# Patient Record
Sex: Male | Born: 1935 | Race: White | Hispanic: No | Marital: Married | Smoking: Former smoker
Health system: Southern US, Community
[De-identification: ages and names within clinical notes are randomized; demographics above are authoritative.]

## PROBLEM LIST (undated history)

## (undated) DIAGNOSIS — I10 Essential (primary) hypertension: Secondary | ICD-10-CM

## (undated) DIAGNOSIS — C4491 Basal cell carcinoma of skin, unspecified: Secondary | ICD-10-CM

## (undated) DIAGNOSIS — N200 Calculus of kidney: Secondary | ICD-10-CM

## (undated) DIAGNOSIS — IMO0002 Reserved for concepts with insufficient information to code with codable children: Secondary | ICD-10-CM

## (undated) DIAGNOSIS — E7800 Pure hypercholesterolemia, unspecified: Secondary | ICD-10-CM

## (undated) HISTORY — PX: TONSILLECTOMY: SUR1361

## (undated) HISTORY — PX: PROSTATE SURGERY: SHX751

## (undated) HISTORY — PX: APPENDECTOMY: SHX54

## (undated) HISTORY — PX: INGUINAL HERNIA REPAIR: SUR1180

---
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
## 2007-07-02 ENCOUNTER — Ambulatory Visit (HOSPITAL_COMMUNITY): Admission: RE | Admit: 2007-07-02 | Discharge: 2007-07-03 | Payer: Self-pay | Admitting: Ophthalmology

## 2010-12-06 NOTE — Op Note (Signed)
Joel Sanchez, Joel Sanchez          ACCOUNT NO.:  [id number]   MEDICAL RECORD NO.:  [id number]          PATIENT TYPE:  AMB   LOCATION:  SDS                          FACILITY:  [facility]   PHYSICIAN:  Rider D. Ashley Royalty, M.D. 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   PROCEDURE:  Scleral buckle, left eye. Retinal photocoagulation, left  eye.   SURGEON:  Keelan Tripodi. Ashley Royalty, M.D.   ASSISTANT:  Rosalie Doctor, M.A.   ANESTHESIA:  General.   DETAILS:  Usual prep and drape, 360-degree limbal peritomy, isolation of  4 rectus muscles on 2-0 silk. Scleral dissection for 360 degrees to  admit a #279 intrascleral implant. Diathermy placed in the bed. A 508G  radial segment was fashioned to fit beneath the break at 1 o'clock.  Perforation site at 3 o'clock revealed a moderate amount of clear  colorless subretinal fluid. The 279 implant was placed against the globe  with the joint at 10 o'clock. A 240 band placed around the eye with a  270 sleeve at 10 o'clock. The buckle was adjusted and trimmed. The band  was adjusted and trimmed. The radial element was placed. Indirect  ophthalmoscopy showed the retina to be lying nicely on the scleral  buckle with the break well supported. Two sutures per quadrant for a  total of 8 scleral sutures were placed in the scleral flaps. These were  knotted and free ends removed. The indirect ophthalmoscopic laser  was  moved into place, and 906 burns were placed around the retinal break and  around the periphery with power between 400 and 800 milliwatts, 1000  microns each and 0.1 seconds. The conjunctivae was reposited with 7-0  chromic suture. Polymyxin and gentamicin were irrigated in the tenon  space. Atropine solution was applied. Marcaine was injected around the  globe for postoperative pain. Decadron 10  mg was injected into the lower  subconjunctival space. Closing pressure was 15 with a Baer keratometer  after paracentesis x3. TobraDex ophthalmic ointment, a patch and shield  were placed. The patient was awakened and taken to recovery room in  satisfactory condition.   COMPLICATIONS:  None.   CLOSING PRESSURE:  15.   OPERATIVE TIME:  2 hours.      Joel Sanchez. Ashley Royalty, M.D.  Electronically Signed     JDM/MEDQ  D:  07/02/2007  T:  07/03/2007  Job:  [id number]

## 2011-05-01 LAB — CBC
HCT: 43.9
RBC: 4.15 — ABNORMAL LOW
WBC: 5.7

## 2011-07-25 DIAGNOSIS — N200 Calculus of kidney: Secondary | ICD-10-CM

## 2011-07-25 HISTORY — DX: Calculus of kidney: N20.0

## 2013-06-23 DIAGNOSIS — IMO0002 Reserved for concepts with insufficient information to code with codable children: Secondary | ICD-10-CM

## 2013-06-23 HISTORY — DX: Reserved for concepts with insufficient information to code with codable children: IMO0002

## 2013-06-23 HISTORY — PX: SKIN CANCER EXCISION: SHX779

## 2013-07-02 ENCOUNTER — Encounter (INDEPENDENT_AMBULATORY_CARE_PROVIDER_SITE_OTHER): Payer: Medicare HMO | Admitting: Ophthalmology

## 2013-07-02 DIAGNOSIS — H33009 Unspecified retinal detachment with retinal break, unspecified eye: Secondary | ICD-10-CM

## 2013-07-02 DIAGNOSIS — H35039 Hypertensive retinopathy, unspecified eye: Secondary | ICD-10-CM

## 2013-07-02 DIAGNOSIS — I10 Essential (primary) hypertension: Secondary | ICD-10-CM

## 2013-07-02 DIAGNOSIS — H43819 Vitreous degeneration, unspecified eye: Secondary | ICD-10-CM

## 2013-07-02 DIAGNOSIS — H4310 Vitreous hemorrhage, unspecified eye: Secondary | ICD-10-CM

## 2013-07-02 NOTE — H&P (Signed)
Joel Sanchez is an 79 y.o. male.   Chief Complaint: sudden floaters and loss of vision left eye HPI: Previous retinal detachment,  Now vitreous hemorrhage left eye  No past medical history on file.  No past surgical history on file.  No family history on file. Social History:  has no tobacco, alcohol, and drug history on file.  Allergies: Allergies not on file  No prescriptions prior to admission    Review of systems otherwise negative  There were no vitals taken for this visit.  Physical exam: Mental status: oriented x3. Eyes: See eye exam associated with this date of surgery in media tab.  Scanned in by scanning center Ears, Nose, Throat: within normal limits Neck: Within Normal limits General: within normal limits Chest: Within normal limits Breast: deferred Heart: Within normal limits Abdomen: Within normal limits GU: deferred Extremities: within normal limits Skin: within normal limits  Assessment/Plan Vitreous hemorrhage left eye Plan: To [facility] for Pars plana vitrectomy, laser, gas injection left eye  Verlaine Embry, DERRIUS FURTICK 07/02/2013, 5:20 PM

## 2013-07-21 NOTE — Progress Notes (Signed)
Attempted to call for SDW questions. No one answered the phone and answering machine came on.  Will attempt to call back later.

## 2013-07-28 ENCOUNTER — Encounter (HOSPITAL_COMMUNITY): Payer: Self-pay | Admitting: *Deleted

## 2013-07-28 MED ORDER — CEFAZOLIN SODIUM-DEXTROSE 2-3 GM-% IV SOLR
2.0000 g | INTRAVENOUS | Status: AC
  Administered 2013-07-29: 2 g via INTRAVENOUS
  Filled 2013-07-28: qty 50

## 2013-07-29 ENCOUNTER — Ambulatory Visit (HOSPITAL_COMMUNITY): Payer: Medicare HMO

## 2013-07-29 ENCOUNTER — Observation Stay (HOSPITAL_COMMUNITY)
Admission: RE | Admit: 2013-07-29 | Discharge: 2013-07-30 | Disposition: A | Discharge status: Home / Self Care | Payer: Medicare HMO | Source: Ambulatory Visit | Attending: Ophthalmology | Admitting: Ophthalmology

## 2013-07-29 ENCOUNTER — Ambulatory Visit (HOSPITAL_COMMUNITY): Payer: Medicare HMO | Admitting: Anesthesiology

## 2013-07-29 ENCOUNTER — Encounter (HOSPITAL_COMMUNITY)
Admission: RE | Disposition: A | Discharge status: Home / Self Care | Payer: Self-pay | Source: Ambulatory Visit | Attending: Ophthalmology

## 2013-07-29 ENCOUNTER — Encounter (HOSPITAL_COMMUNITY): Payer: Medicare HMO | Admitting: Anesthesiology

## 2013-07-29 ENCOUNTER — Encounter (HOSPITAL_COMMUNITY): Payer: Self-pay | Admitting: Anesthesiology

## 2013-07-29 DIAGNOSIS — H33009 Unspecified retinal detachment with retinal break, unspecified eye: Secondary | ICD-10-CM

## 2013-07-29 DIAGNOSIS — H33022 Retinal detachment with multiple breaks, left eye: Secondary | ICD-10-CM

## 2013-07-29 DIAGNOSIS — I10 Essential (primary) hypertension: Secondary | ICD-10-CM | POA: Insufficient documentation

## 2013-07-29 DIAGNOSIS — H33029 Retinal detachment with multiple breaks, unspecified eye: Secondary | ICD-10-CM | POA: Insufficient documentation

## 2013-07-29 DIAGNOSIS — H3340 Traction detachment of retina, unspecified eye: Secondary | ICD-10-CM | POA: Insufficient documentation

## 2013-07-29 DIAGNOSIS — Z87891 Personal history of nicotine dependence: Secondary | ICD-10-CM | POA: Insufficient documentation

## 2013-07-29 DIAGNOSIS — H4310 Vitreous hemorrhage, unspecified eye: Principal | ICD-10-CM | POA: Insufficient documentation

## 2013-07-29 HISTORY — DX: Reserved for concepts with insufficient information to code with codable children: IMO0002

## 2013-07-29 HISTORY — DX: Pure hypercholesterolemia, unspecified: E78.00

## 2013-07-29 HISTORY — DX: Basal cell carcinoma of skin, unspecified: C44.91

## 2013-07-29 HISTORY — DX: Essential (primary) hypertension: I10

## 2013-07-29 HISTORY — PX: GAS/FLUID EXCHANGE: SHX5334

## 2013-07-29 HISTORY — PX: PARS PLANA VITRECTOMY: SHX2166

## 2013-07-29 HISTORY — DX: Calculus of kidney: N20.0

## 2013-07-29 LAB — BASIC METABOLIC PANEL
BUN: 14 mg/dL (ref 6–23)
CHLORIDE: 99 meq/L (ref 96–112)
CO2: 27 mEq/L (ref 19–32)
Calcium: 9.4 mg/dL (ref 8.4–10.5)
Creatinine, Ser: 0.74 mg/dL (ref 0.50–1.35)
GFR calc Af Amer: >90 mL/min (ref >90)
GFR, EST NON AFRICAN AMERICAN: 84 mL/min — AB (ref >90)
Glucose, Bld: 105 mg/dL — ABNORMAL HIGH (ref 70–99)
POTASSIUM: 5 meq/L (ref 3.7–5.3)
Sodium: 139 mEq/L (ref 137–147)

## 2013-07-29 LAB — CBC
HCT: 42 % (ref 39.0–52.0)
Hemoglobin: 14.3 g/dL (ref 13.0–17.0)
MCH: 37.2 pg — AB (ref 26.0–34.0)
MCHC: 34 g/dL (ref 30.0–36.0)
MCV: 109.4 fL — AB (ref 78.0–100.0)
PLATELETS: 222 10*3/uL (ref 150–400)
RBC: 3.84 MIL/uL — ABNORMAL LOW (ref 4.22–5.81)
RDW: 13.2 % (ref 11.5–15.5)
WBC: 6.2 10*3/uL (ref 4.0–10.5)

## 2013-07-29 SURGERY — PARS PLANA VITRECTOMY WITH 25 GAUGE
Anesthesia: General | Site: Eye | Laterality: Left

## 2013-07-29 MED ORDER — SODIUM HYALURONATE 10 MG/ML IO SOLN
INTRAOCULAR | Status: DC | PRN
  Administered 2013-07-29: 0.85 mL via INTRAOCULAR

## 2013-07-29 MED ORDER — CYCLOPENTOLATE HCL 1 % OP SOLN
1.0000 [drp] | OPHTHALMIC | Status: AC | PRN
  Administered 2013-07-29 (×2): 1 [drp] via OPHTHALMIC

## 2013-07-29 MED ORDER — ONDANSETRON HCL 4 MG/2ML IJ SOLN
INTRAMUSCULAR | Status: DC | PRN
  Administered 2013-07-29: 4 mg via INTRAVENOUS

## 2013-07-29 MED ORDER — BACITRACIN-POLYMYXIN B 500-10000 UNIT/GM OP OINT
TOPICAL_OINTMENT | OPHTHALMIC | Status: DC | PRN
  Administered 2013-07-29: 1 via OPHTHALMIC

## 2013-07-29 MED ORDER — LIDOCAINE HCL (CARDIAC) 20 MG/ML IV SOLN
INTRAVENOUS | Status: DC | PRN
  Administered 2013-07-29: 10 mg via INTRAVENOUS

## 2013-07-29 MED ORDER — ATORVASTATIN CALCIUM 20 MG PO TABS
20.0000 mg | ORAL_TABLET | Freq: Every day | ORAL | Status: DC
  Administered 2013-07-29: 20 mg via ORAL
  Filled 2013-07-29 (×2): qty 1

## 2013-07-29 MED ORDER — NEOSTIGMINE METHYLSULFATE 1 MG/ML IJ SOLN
INTRAMUSCULAR | Status: DC | PRN
  Administered 2013-07-29: 3 mg via INTRAVENOUS

## 2013-07-29 MED ORDER — ARTIFICIAL TEARS OP OINT
TOPICAL_OINTMENT | OPHTHALMIC | Status: DC | PRN
  Administered 2013-07-29: 1 via OPHTHALMIC

## 2013-07-29 MED ORDER — BRIMONIDINE TARTRATE 0.2 % OP SOLN
1.0000 [drp] | Freq: Two times a day (BID) | OPHTHALMIC | Status: DC
  Filled 2013-07-29: qty 5

## 2013-07-29 MED ORDER — PHENYLEPHRINE HCL 10 MG/ML IJ SOLN
INTRAMUSCULAR | Status: DC | PRN
  Administered 2013-07-29 (×2): 80 ug via INTRAVENOUS
  Administered 2013-07-29: 40 ug via INTRAVENOUS
  Administered 2013-07-29 (×3): 80 ug via INTRAVENOUS

## 2013-07-29 MED ORDER — EPINEPHRINE HCL 1 MG/ML IJ SOLN
INTRAOCULAR | Status: DC | PRN
  Administered 2013-07-29: 11:00:00

## 2013-07-29 MED ORDER — ACETAZOLAMIDE SODIUM 500 MG IJ SOLR
500.0000 mg | Freq: Once | INTRAMUSCULAR | Status: AC
  Administered 2013-07-30: 500 mg via INTRAVENOUS
  Filled 2013-07-29: qty 500

## 2013-07-29 MED ORDER — CYCLOPENTOLATE HCL 1 % OP SOLN
OPHTHALMIC | Status: AC
  Administered 2013-07-29: 1 [drp] via OPHTHALMIC
  Filled 2013-07-29: qty 2

## 2013-07-29 MED ORDER — PROPOFOL 10 MG/ML IV BOLUS
INTRAVENOUS | Status: DC | PRN
  Administered 2013-07-29: 100 mg via INTRAVENOUS

## 2013-07-29 MED ORDER — TROPICAMIDE 1 % OP SOLN
1.0000 [drp] | OPHTHALMIC | Status: AC | PRN
  Administered 2013-07-29 (×3): 1 [drp] via OPHTHALMIC

## 2013-07-29 MED ORDER — TEMAZEPAM 15 MG PO CAPS: 15.0000 mg | ORAL_CAPSULE | Freq: Every evening | ORAL | Status: DC | PRN

## 2013-07-29 MED ORDER — LATANOPROST 0.005 % OP SOLN
1.0000 [drp] | Freq: Every day | OPHTHALMIC | Status: DC
  Filled 2013-07-29: qty 2.5

## 2013-07-29 MED ORDER — DOCUSATE SODIUM 100 MG PO CAPS
100.0000 mg | ORAL_CAPSULE | Freq: Two times a day (BID) | ORAL | Status: DC
  Filled 2013-07-29: qty 1

## 2013-07-29 MED ORDER — SODIUM CHLORIDE 0.45 % IV SOLN
INTRAVENOUS | Status: DC
  Administered 2013-07-29: 17:00:00 via INTRAVENOUS

## 2013-07-29 MED ORDER — 0.9 % SODIUM CHLORIDE (POUR BTL) OPTIME
TOPICAL | Status: DC | PRN
  Administered 2013-07-29: 1000 mL

## 2013-07-29 MED ORDER — LISINOPRIL 20 MG PO TABS
20.0000 mg | ORAL_TABLET | Freq: Every evening | ORAL | Status: DC
  Administered 2013-07-29: 20 mg via ORAL
  Filled 2013-07-29 (×2): qty 1

## 2013-07-29 MED ORDER — PREDNISOLONE ACETATE 1 % OP SUSP
1.0000 [drp] | Freq: Four times a day (QID) | OPHTHALMIC | Status: DC
  Filled 2013-07-29: qty 1

## 2013-07-29 MED ORDER — GATIFLOXACIN 0.5 % OP SOLN
1.0000 [drp] | Freq: Four times a day (QID) | OPHTHALMIC | Status: DC
  Filled 2013-07-29: qty 2.5

## 2013-07-29 MED ORDER — FENTANYL CITRATE 0.05 MG/ML IJ SOLN
INTRAMUSCULAR | Status: DC | PRN
Start: 2013-07-29 — End: 2013-07-29
  Administered 2013-07-29 (×2): 50 ug via INTRAVENOUS

## 2013-07-29 MED ORDER — ATROPINE SULFATE 1 % OP SOLN
OPHTHALMIC | Status: DC | PRN
  Administered 2013-07-29 (×2): 2 [drp] via OPHTHALMIC

## 2013-07-29 MED ORDER — FENTANYL CITRATE 0.05 MG/ML IJ SOLN: 25.0000 ug | INTRAMUSCULAR | Status: DC | PRN

## 2013-07-29 MED ORDER — TROPICAMIDE 1 % OP SOLN
OPHTHALMIC | Status: AC
  Filled 2013-07-29: qty 3

## 2013-07-29 MED ORDER — HYDROCODONE-ACETAMINOPHEN 5-325 MG PO TABS
1.0000 | ORAL_TABLET | ORAL | Status: DC | PRN
Start: 2013-07-29 — End: 2013-07-30

## 2013-07-29 MED ORDER — DEXAMETHASONE SODIUM PHOSPHATE 10 MG/ML IJ SOLN
INTRAMUSCULAR | Status: DC | PRN
  Administered 2013-07-29: 10 mg

## 2013-07-29 MED ORDER — ACETAMINOPHEN 325 MG PO TABS: 325.0000 mg | ORAL_TABLET | ORAL | Status: DC | PRN

## 2013-07-29 MED ORDER — OXYCODONE HCL 5 MG/5ML PO SOLN: 5.0000 mg | Freq: Once | ORAL | Status: DC | PRN

## 2013-07-29 MED ORDER — ONDANSETRON HCL 4 MG/2ML IJ SOLN: 4.0000 mg | Freq: Four times a day (QID) | INTRAMUSCULAR | Status: DC | PRN

## 2013-07-29 MED ORDER — PHENYLEPHRINE HCL 2.5 % OP SOLN
1.0000 [drp] | OPHTHALMIC | Status: AC | PRN
  Administered 2013-07-29: 1 [drp] via OPHTHALMIC

## 2013-07-29 MED ORDER — TETRACAINE HCL 0.5 % OP SOLN
2.0000 [drp] | Freq: Once | OPHTHALMIC | Status: DC
  Filled 2013-07-29: qty 2

## 2013-07-29 MED ORDER — MEPERIDINE HCL 25 MG/ML IJ SOLN: 6.2500 mg | INTRAMUSCULAR | Status: DC | PRN

## 2013-07-29 MED ORDER — GATIFLOXACIN 0.5 % OP SOLN
1.0000 [drp] | OPHTHALMIC | Status: AC | PRN
  Administered 2013-07-29 (×2): 1 [drp] via OPHTHALMIC

## 2013-07-29 MED ORDER — GATIFLOXACIN 0.5 % OP SOLN
OPHTHALMIC | Status: AC
  Administered 2013-07-29: 1 [drp] via OPHTHALMIC
  Filled 2013-07-29: qty 2.5

## 2013-07-29 MED ORDER — GLYCOPYRROLATE 0.2 MG/ML IJ SOLN
INTRAMUSCULAR | Status: DC | PRN
  Administered 2013-07-29: 0.4 mg via INTRAVENOUS

## 2013-07-29 MED ORDER — BACITRACIN-POLYMYXIN B 500-10000 UNIT/GM OP OINT
1.0000 "application " | TOPICAL_OINTMENT | Freq: Four times a day (QID) | OPHTHALMIC | Status: DC
  Filled 2013-07-29: qty 3.5

## 2013-07-29 MED ORDER — ROCURONIUM BROMIDE 100 MG/10ML IV SOLN
INTRAVENOUS | Status: DC | PRN
  Administered 2013-07-29: 40 mg via INTRAVENOUS

## 2013-07-29 MED ORDER — SODIUM CHLORIDE 0.9 % IJ SOLN
INTRAMUSCULAR | Status: DC | PRN
  Administered 2013-07-29: 11:00:00

## 2013-07-29 MED ORDER — MORPHINE SULFATE 2 MG/ML IJ SOLN: 1.0000 mg | INTRAMUSCULAR | Status: DC | PRN

## 2013-07-29 MED ORDER — OXYCODONE HCL 5 MG PO TABS: 5.0000 mg | ORAL_TABLET | Freq: Once | ORAL | Status: DC | PRN

## 2013-07-29 MED ORDER — BUPIVACAINE HCL (PF) 0.75 % IJ SOLN
INTRAMUSCULAR | Status: DC | PRN
  Administered 2013-07-29: 10 mL

## 2013-07-29 MED ORDER — PROMETHAZINE HCL 25 MG/ML IJ SOLN: 6.2500 mg | INTRAMUSCULAR | Status: DC | PRN

## 2013-07-29 MED ORDER — SODIUM CHLORIDE 0.9 % IV SOLN
INTRAVENOUS | Status: DC
  Administered 2013-07-29: 12:00:00 via INTRAVENOUS
  Administered 2013-07-29: 20 mL/h via INTRAVENOUS

## 2013-07-29 MED ORDER — MAGNESIUM HYDROXIDE 400 MG/5ML PO SUSP: 15.0000 mL | Freq: Four times a day (QID) | ORAL | Status: DC | PRN

## 2013-07-29 MED ORDER — PHENYLEPHRINE HCL 2.5 % OP SOLN
OPHTHALMIC | Status: AC
  Administered 2013-07-29 (×2): 1 [drp] via OPHTHALMIC
  Filled 2013-07-29: qty 15

## 2013-07-29 SURGICAL SUPPLY — 70 items
APPLICATOR DR MATTHEWS STRL (MISCELLANEOUS) IMPLANT
BLADE EYE CATARACT 19 1.4 BEAV (BLADE) IMPLANT
BLADE MVR KNIFE 19G (BLADE) IMPLANT
BLADE MVR KNIFE 20G (BLADE) ×3 IMPLANT
CANNULA DUAL BORE 23G (CANNULA) ×3 IMPLANT
CANNULA TROCAR 23 GA VLV (OPHTHALMIC) ×3 IMPLANT
CANNULA VLV SOFT TIP 25GA (OPHTHALMIC) ×3 IMPLANT
CLOTH BEACON ORANGE TIMEOUT ST (SAFETY) IMPLANT
CORDS BIPOLAR (ELECTRODE) IMPLANT
COTTONBALL LRG STERILE PKG (GAUZE/BANDAGES/DRESSINGS) ×9 IMPLANT
COVER MAYO STAND STRL (DRAPES) ×3 IMPLANT
DRAPE INCISE 51X51 W/FILM STRL (DRAPES) ×3 IMPLANT
DRAPE OPHTHALMIC 77X100 STRL (CUSTOM PROCEDURE TRAY) ×3 IMPLANT
FILTER BLUE MILLIPORE (MISCELLANEOUS) IMPLANT
FILTER STRAW FLUID ASPIR (MISCELLANEOUS) IMPLANT
FORCEPS ECKARDT ILM 25G SERR (OPHTHALMIC RELATED) IMPLANT
FORCEPS GRIESHABER ILM 25G A (INSTRUMENTS) IMPLANT
GAS AUTO FILL CONSTEL (OPHTHALMIC) ×3
GAS AUTO FILL CONSTELLATION (OPHTHALMIC) ×1 IMPLANT
GLOVE SS BIOGEL STRL SZ 6.5 (GLOVE) ×1 IMPLANT
GLOVE SS BIOGEL STRL SZ 7 (GLOVE) ×1 IMPLANT
GLOVE SUPERSENSE BIOGEL SZ 6.5 (GLOVE) ×2
GLOVE SUPERSENSE BIOGEL SZ 7 (GLOVE) ×2
GLOVE SURG 8.5 LATEX PF (GLOVE) ×3 IMPLANT
GLOVE SURG SS PI 7.0 STRL IVOR (GLOVE) ×6 IMPLANT
GOWN STRL NON-REIN LRG LVL3 (GOWN DISPOSABLE) ×9 IMPLANT
HANDLE PNEUMATIC FOR CONSTEL (OPHTHALMIC) IMPLANT
KIT BASIN OR (CUSTOM PROCEDURE TRAY) ×3 IMPLANT
KIT PERFLUORON PROCEDURE 5ML (MISCELLANEOUS) ×3 IMPLANT
KNIFE CRESCENT 2.5 55 ANG (BLADE) IMPLANT
MASK EYE SHIELD (GAUZE/BANDAGES/DRESSINGS) ×3 IMPLANT
MICROPICK 25G (MISCELLANEOUS)
NEEDLE 18GX1X1/2 (RX/OR ONLY) (NEEDLE) ×3 IMPLANT
NEEDLE 25GX 5/8IN NON SAFETY (NEEDLE) ×3 IMPLANT
NEEDLE 27GAX1X1/2 (NEEDLE) IMPLANT
NEEDLE FILTER BLUNT 18X 1/2SAF (NEEDLE) ×2
NEEDLE FILTER BLUNT 18X1 1/2 (NEEDLE) ×1 IMPLANT
NEEDLE HYPO 30X.5 LL (NEEDLE) ×3 IMPLANT
NS IRRIG 1000ML POUR BTL (IV SOLUTION) ×3 IMPLANT
PACK VITRECTOMY CUSTOM (CUSTOM PROCEDURE TRAY) ×3 IMPLANT
PAD ARMBOARD 7.5X6 YLW CONV (MISCELLANEOUS) ×6 IMPLANT
PAD EYE OVAL STERILE LF (GAUZE/BANDAGES/DRESSINGS) ×3 IMPLANT
PAK PIK VITRECTOMY CVS 25GA (OPHTHALMIC) ×3 IMPLANT
PENCIL BIPOLAR 25GA STR DISP (OPHTHALMIC RELATED) IMPLANT
PIC ILLUMINATED 25G (OPHTHALMIC) ×3
PICK MICROPICK 25G (MISCELLANEOUS) IMPLANT
PIK ILLUMINATED 25G (OPHTHALMIC) ×1 IMPLANT
PROBE LASER ILLUM FLEX CVD 25G (OPHTHALMIC) ×3 IMPLANT
REPL STRA BRUSH NEEDLE (NEEDLE) IMPLANT
RESERVOIR BACK FLUSH (MISCELLANEOUS) IMPLANT
ROLLS DENTAL (MISCELLANEOUS) ×6 IMPLANT
SCISSORS TIP ADVANCED DSP 25GA (INSTRUMENTS) IMPLANT
SCRAPER DIAMOND DUST MEMBRANE (MISCELLANEOUS) IMPLANT
SPONGE SURGIFOAM ABS GEL 12-7 (HEMOSTASIS) ×3 IMPLANT
STOPCOCK 4 WAY LG BORE MALE ST (IV SETS) IMPLANT
SUT CHROMIC 7 0 TG140 8 (SUTURE) IMPLANT
SUT ETHILON 10 0 CS140 6 (SUTURE) IMPLANT
SUT ETHILON 9 0 TG140 8 (SUTURE) IMPLANT
SUT POLY NON ABSORB 10-0 8 STR (SUTURE) IMPLANT
SUT SILK 4 0 RB 1 (SUTURE) IMPLANT
SYR 20CC LL (SYRINGE) ×3 IMPLANT
SYR 5ML LL (SYRINGE) IMPLANT
SYR BULB 3OZ (MISCELLANEOUS) ×3 IMPLANT
SYR TB 1ML LUER SLIP (SYRINGE) ×3 IMPLANT
SYRINGE 10CC LL (SYRINGE) IMPLANT
TAPE SURG TRANSPORE 1 IN (GAUZE/BANDAGES/DRESSINGS) ×1 IMPLANT
TAPE SURGICAL TRANSPORE 1 IN (GAUZE/BANDAGES/DRESSINGS) ×2
TOWEL OR 17X24 6PK STRL BLUE (TOWEL DISPOSABLE) ×9 IMPLANT
WATER STERILE IRR 1000ML POUR (IV SOLUTION) ×3 IMPLANT
WIPE INSTRUMENT VISIWIPE 73X73 (MISCELLANEOUS) ×3 IMPLANT

## 2013-07-29 NOTE — Brief Op Note (Signed)
07/29/2013  12:54 PM  PATIENT:  Joel Sanchez  80 y.o. male  PRE-OPERATIVE DIAGNOSIS:  vitreous hemorrhage left eye  POST-OPERATIVE DIAGNOSIS:  vitreous hemorrhage left eye  PROCEDURE:  Procedure(s): PARS PLANA VITRECTOMY WITH 25 GAUGE WITH ENDOLASER (Left)  Repair of complex retinal detachment with pars plana vitrectomy, laser and gas injection  SURGEON:  Surgeon(s) and Role:    * Hayden Pedro, MD - Primary  Brief Operative note   Preoperative diagnosis:  vitreous hemorrhage left eye Postoperative diagnosis  Post-Op Diagnosis Codes:    * Vitreous hemorrhage, left [379.23] Rhegmatogenous retinal detachment left eye  Procedures: @ORPROCAL @  Surgeon:  Hayden Pedro, MD...  Assistant:  Deatra Ina SA   Anesthesia: General  Specimen: none  Estimated blood loss:  1cc  Complications: none  Patient sent to PACU in good condition  Composed by Hayden Pedro MD  Dictation [phone]

## 2013-07-29 NOTE — OR Nursing (Signed)
Nitrous oxide bracelet put on patient's right arm at the end of case

## 2013-07-29 NOTE — Anesthesia Preprocedure Evaluation (Addendum)
Anesthesia Evaluation  Patient identified by MRN, date of birth, ID band Patient awake    Reviewed: Allergy & Precautions, H&P , NPO status , Patient's Chart, lab work & pertinent test results  History of Anesthesia Complications Negative for: history of anesthetic complications  Airway Mallampati: III TM Distance: >3 FB Neck ROM: Full    Dental  (+) Teeth Intact and Dental Advisory Given   Pulmonary former smoker,  breath sounds clear to auscultation  Pulmonary exam normal       Cardiovascular hypertension, Pt. on medications Rhythm:Regular Rate:Normal     Neuro/Psych negative neurological ROS     GI/Hepatic negative GI ROS, Neg liver ROS,   Endo/Other  negative endocrine ROS  Renal/GU negative Renal ROS     Musculoskeletal   Abdominal   Peds  Hematology   Anesthesia Other Findings   Reproductive/Obstetrics negative OB ROS                          Anesthesia Physical Anesthesia Plan  ASA: II  Anesthesia Plan: General   Post-op Pain Management:    Induction: Intravenous  Airway Management Planned: Oral ETT  Additional Equipment:   Intra-op Plan:   Post-operative Plan: Extubation in OR  Informed Consent:   Dental advisory given  Plan Discussed with: CRNA and Surgeon  Anesthesia Plan Comments: (Plan routine monitors, GETA)        Anesthesia Quick Evaluation

## 2013-07-29 NOTE — Anesthesia Procedure Notes (Signed)
Procedure Name: Intubation Date/Time: 07/29/2013 11:47 AM Performed by: Jenne Campus Pre-anesthesia Checklist: Patient identified, Emergency Drugs available, Suction available, Patient being monitored and Timeout performed Patient Re-evaluated:Patient Re-evaluated prior to inductionOxygen Delivery Method: Circle system utilized Preoxygenation: Pre-oxygenation with 100% oxygen Intubation Type: IV induction Ventilation: Mask ventilation without difficulty Laryngoscope Size: Miller and 2 Grade View: Grade II Tube type: Oral Tube size: 7.5 mm Number of attempts: 1 Airway Equipment and Method: Stylet Placement Confirmation: ETT inserted through vocal cords under direct vision,  positive ETCO2,  CO2 detector and breath sounds checked- equal and bilateral Secured at: 22 cm Tube secured with: Tape Dental Injury: Teeth and Oropharynx as per pre-operative assessment

## 2013-07-29 NOTE — Anesthesia Postprocedure Evaluation (Signed)
  Anesthesia Post-op Note  Patient: Joel Sanchez  Procedure(s) Performed: Procedure(s): PARS PLANA VITRECTOMY WITH 25 GAUGE WITH ENDOLASER (Left) GAS/FLUID EXCHANGE (Left)  Patient Location: PACU  Anesthesia Type:General  Level of Consciousness: awake, alert , oriented and patient cooperative  Airway and Oxygen Therapy: Patient Spontanous Breathing  Post-op Pain: none  Post-op Assessment: Post-op Vital signs reviewed, Patient's Cardiovascular Status Stable, Respiratory Function Stable, Patent Airway, No signs of Nausea or vomiting and Pain level controlled  Post-op Vital Signs: Reviewed and stable  Complications: No apparent anesthesia complications

## 2013-07-29 NOTE — H&P (Signed)
I examined the patient today and there is no change in the medical status 

## 2013-07-29 NOTE — Transfer of Care (Signed)
Immediate Anesthesia Transfer of Care Note  Patient: Joel Sanchez  Procedure(s) Performed: Procedure(s): PARS PLANA VITRECTOMY WITH 25 GAUGE WITH ENDOLASER (Left)  Patient Location: PACU  Anesthesia Type:General  Level of Consciousness: awake, alert , oriented and patient cooperative  Airway & Oxygen Therapy: Patient Spontanous Breathing and Patient connected to nasal cannula oxygen  Post-op Assessment: Report given to PACU RN and Post -op Vital signs reviewed and stable  Post vital signs: Reviewed  Complications: No apparent anesthesia complications

## 2013-07-29 NOTE — Preoperative (Signed)
Beta Blockers   Reason not to administer Beta Blockers:Not Applicable 

## 2013-07-30 ENCOUNTER — Encounter (HOSPITAL_COMMUNITY): Payer: Self-pay | Admitting: Ophthalmology

## 2013-07-30 NOTE — Discharge Summary (Signed)
Discharge summary not needed on [facility] patients per medical records. 

## 2013-07-30 NOTE — Progress Notes (Signed)
07/30/2013, 6:26 AM  Mental Status:  Awake, Alert, Oriented  Anterior segment: Cornea  Clear    Anterior Chamber Clear    Lens:   IOL,  Intra Ocular Pressure 21 mmHg with Tonopen  Vitreous: Clear 95%gas bubble   Retina:  Attached Good laser reaction   Impression: Excellent result Retina attached  Final Diagnosis: Active Problems:   Retinal detachment of left eye with multiple breaks   Plan: start post operative eye drops.  Discharge to home.  Give post operative instructions  MATTHEO, SWINDLE 07/30/2013, 6:26 AM

## 2013-07-30 NOTE — Op Note (Signed)
NAMEBRYCE, Joel Sanchez NO.:  [id number]  MEDICAL RECORD NO.:  [id number]  LOCATION:  6N05C                        FACILITY:  [facility]  PHYSICIAN:  Acey Woodfield. Zigmund Daniel, M.D. DATE OF BIRTH:  1929-04-12  DATE OF PROCEDURE:  07/29/2013 DATE OF DISCHARGE:                              OPERATIVE REPORT   ADMISSION DIAGNOSES:  Vitreous hemorrhage, rhegmatogenous retinal detachment in the left eye.  PROCEDURES:  Pars plana vitrectomy, Perfluoron injection, laser photocoagulation, gas fluid exchange, C3F8 injection, and repair of complex tractional retinal detachment, left eye.  SURGEON:  Josey Forcier. Zigmund Daniel, M.D.  ASSISTANT:  Deatra Ina, SA.  ANESTHESIA:  General.  DETAILS:  After usual prep and drape, 25-gauge trocars placed at 10, 2, and 4 o'clock infusion at 4 o'clock.  Provisc placed on the corneal surface.  The biome viewing system was moved into place.  Pars plana vitrectomy was begun just behind the pseudophakos.  Blood and white membranes were encountered.  These were carefully removed under low suction and rapid cutting.  As the vitrectomy proceeded posteriorly, it was apparent that the nasal retina was detached.  A break was seen elevated off of the buckle at 9 o'clock.  The vitrectomy was continued down to the macular surface where blood and vitreous were removed.  The vitrectomy was continued to the mid periphery where additional vitreous was removed.  The vitrectomy was carried into the far periphery with Super Wide BIOM Viewing System.  The vitreous was trimmed down to the vitreous base and trimmed away from the detached area.  Perfluoron was injected and re-detachment of the entire retina was accomplished.  The endolaser was positioned in the eye.  666 burns were placed on the scleral buckle and around the area of detached retina.  The power was 400 mW, 1000 microns each, and 0.1 seconds each.  The retina was entirely attached, Perfluoron was removed, and  exchanged for intravitreal gas.  All Perfluoron was removed.  The macula and periphery was inspected looking for additional Perfluoron and no additional Perfluoron was seen.  A C3F8 10% mixture was then prepared and this was exchanged for intravitreal gas.  The instruments were removed from the eye and the trocars were removed from the eye one at a time.  Each wound was tested and found to be secure.  Polymyxin and gentamicin were irrigated into tenon space.  Atropine solution was applied.  Marcaine was injected around the globe for postop pain.  Closing pressure was 10 with a Pharmacologist.  Complications were none.  Duration 1 hour. The patient was awakened and taken to recovery in a satisfactory condition.    Chrystie Nose. Zigmund Daniel, M.D.    JDM/MEDQ  D:  07/29/2013  T:  07/30/2013  Job:  [id number]

## 2013-08-06 ENCOUNTER — Inpatient Hospital Stay (INDEPENDENT_AMBULATORY_CARE_PROVIDER_SITE_OTHER): Payer: Medicare HMO | Admitting: Ophthalmology

## 2013-08-07 ENCOUNTER — Inpatient Hospital Stay (INDEPENDENT_AMBULATORY_CARE_PROVIDER_SITE_OTHER): Payer: Medicare HMO | Admitting: Ophthalmology

## 2013-08-07 DIAGNOSIS — H33009 Unspecified retinal detachment with retinal break, unspecified eye: Secondary | ICD-10-CM

## 2013-08-28 ENCOUNTER — Encounter (INDEPENDENT_AMBULATORY_CARE_PROVIDER_SITE_OTHER): Payer: Medicare HMO | Admitting: Ophthalmology

## 2013-08-28 DIAGNOSIS — H33009 Unspecified retinal detachment with retinal break, unspecified eye: Secondary | ICD-10-CM

## 2013-11-06 ENCOUNTER — Encounter (INDEPENDENT_AMBULATORY_CARE_PROVIDER_SITE_OTHER): Payer: Medicare HMO | Admitting: Ophthalmology

## 2013-11-06 DIAGNOSIS — I10 Essential (primary) hypertension: Secondary | ICD-10-CM

## 2013-11-06 DIAGNOSIS — H35039 Hypertensive retinopathy, unspecified eye: Secondary | ICD-10-CM

## 2013-11-06 DIAGNOSIS — H43819 Vitreous degeneration, unspecified eye: Secondary | ICD-10-CM

## 2013-11-06 DIAGNOSIS — H33009 Unspecified retinal detachment with retinal break, unspecified eye: Secondary | ICD-10-CM

## 2014-01-12 IMAGING — CR DG CHEST 2V
2 series · 2 of 2 positions shown · non-contrast
Comparison: 07/02/2007.

CLINICAL DATA: Hypertension.  Preop chest x-ray.

EXAM:
CHEST  2 VIEW

[w chest pa]
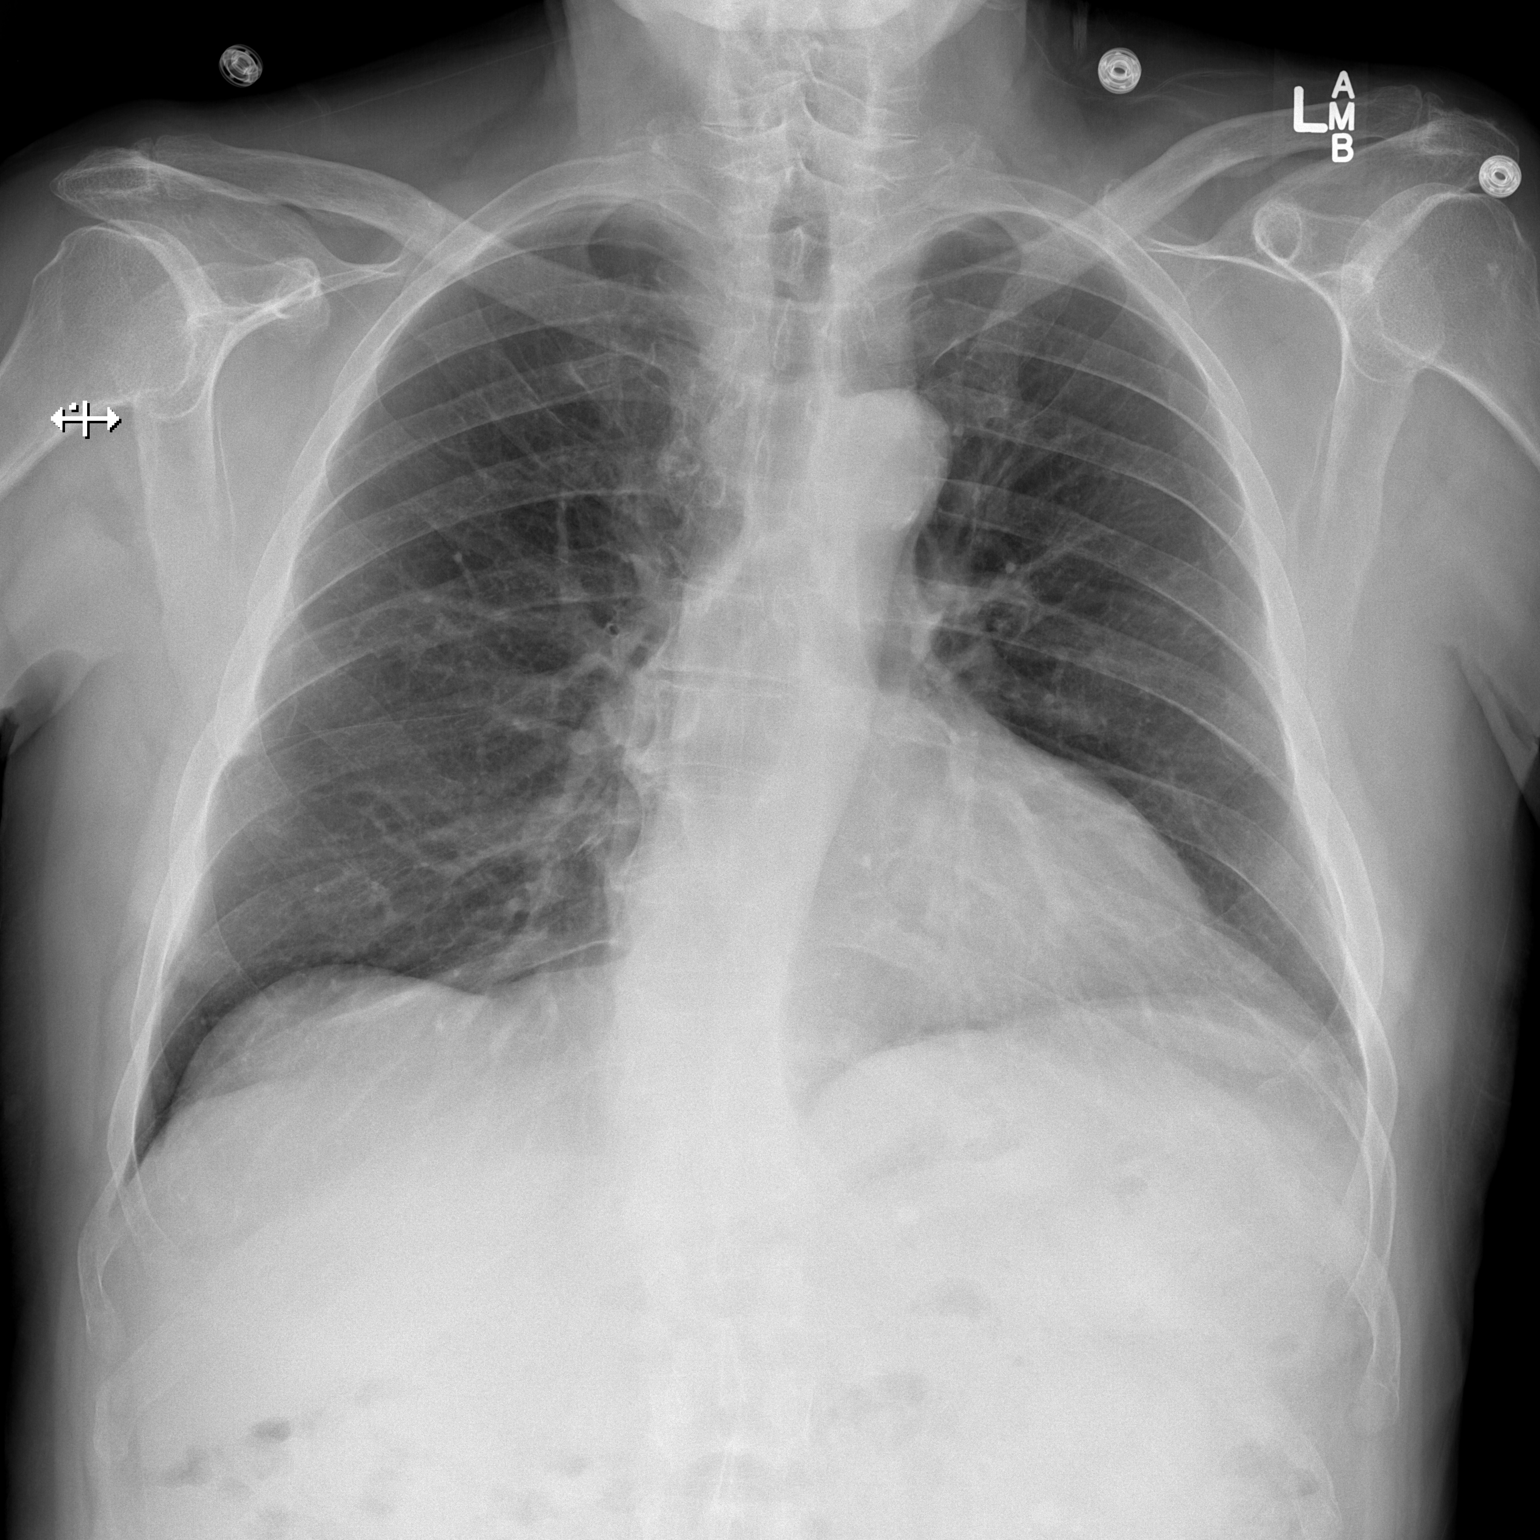

[w chest lat]
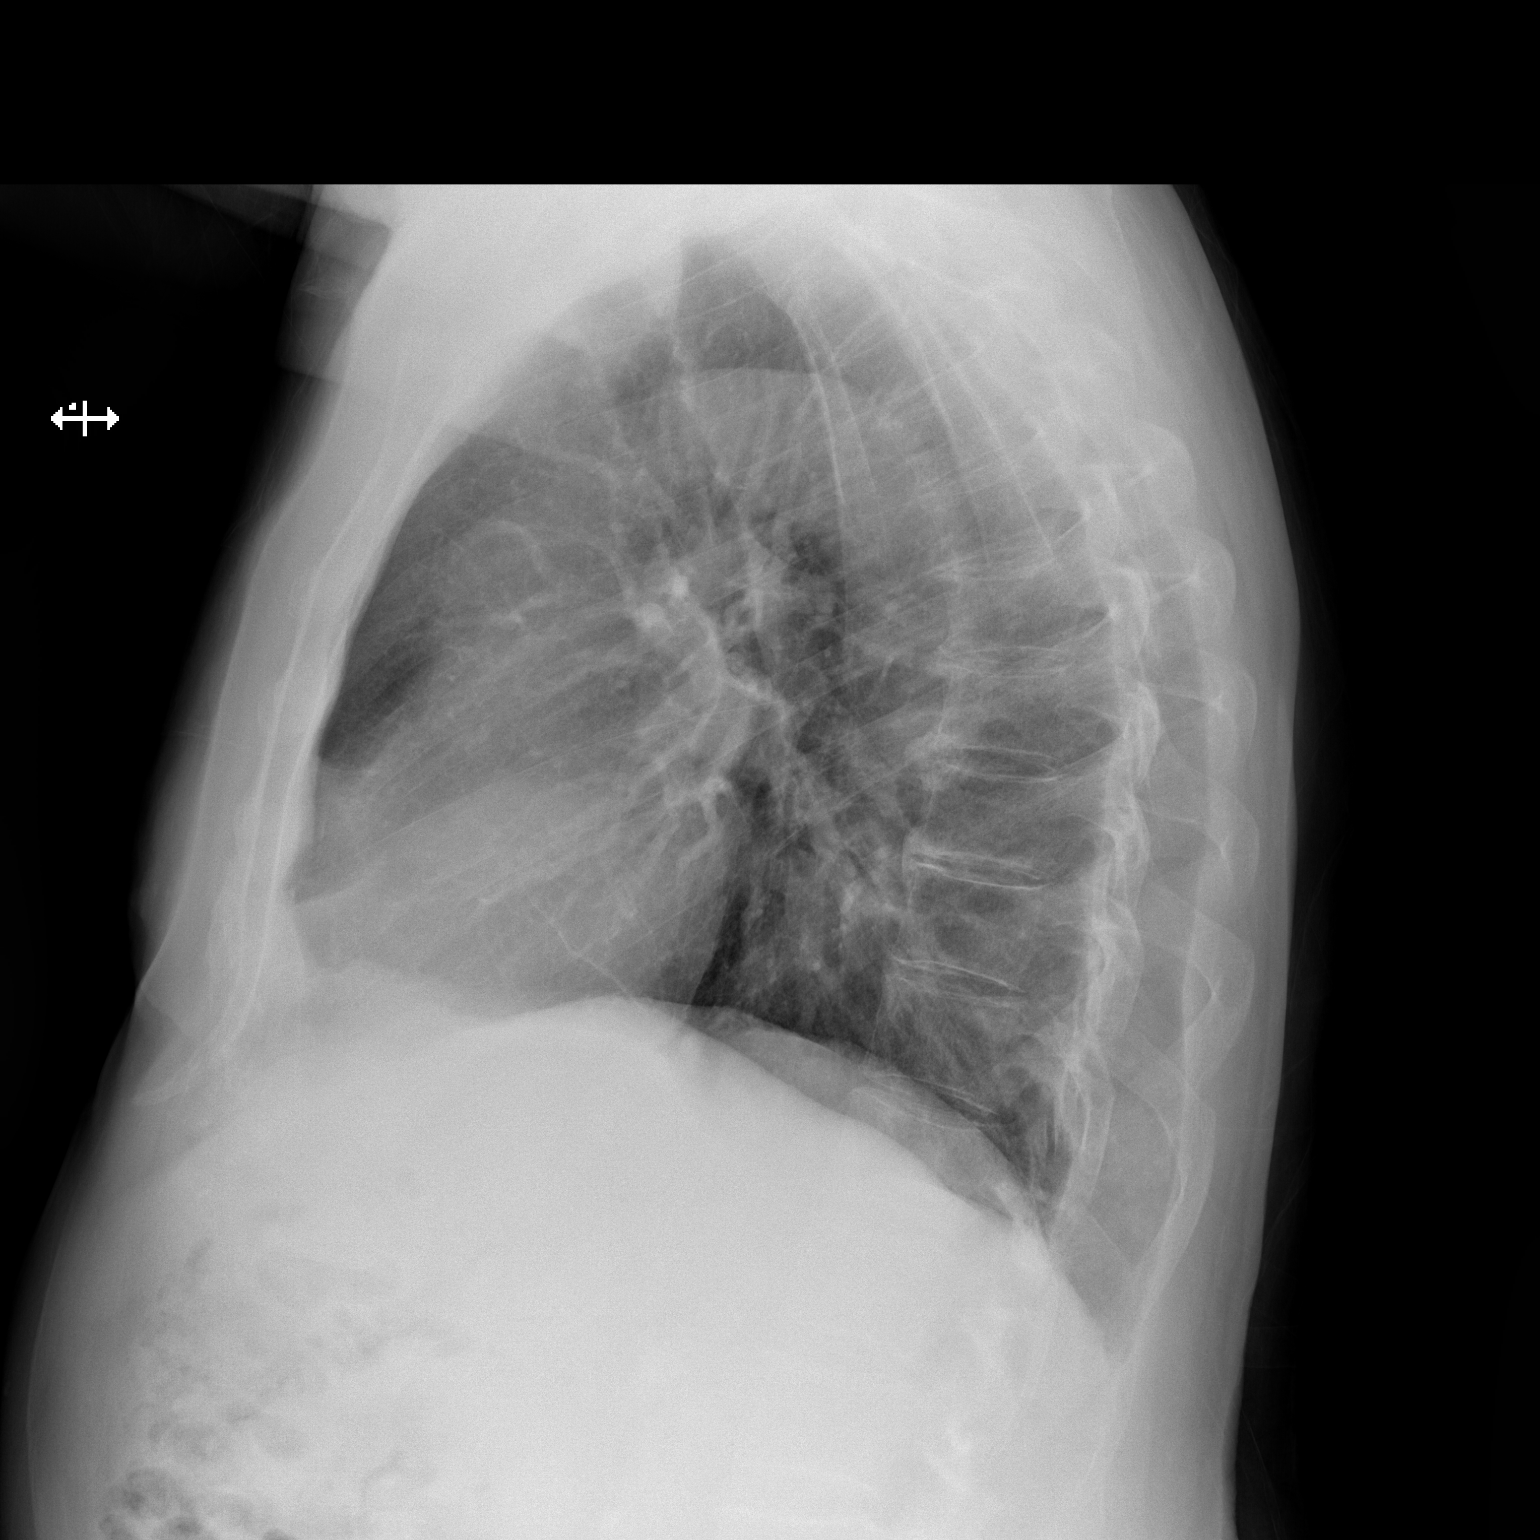

[2 of 2 positions shown; findings below may reference images not displayed]

FINDINGS: Mediastinum and hilar structures normal. Lungs are clear of acute
infiltrates. Mild pleural parenchymal thickening in the left lung
base might noted on, unchanged consistent with scarring. Scoliosis
thoracic spine. Degenerative changes both shoulders.
IMPRESSION: No active cardiopulmonary disease.  Mild scarring left lung base.

## 2014-08-12 ENCOUNTER — Ambulatory Visit (INDEPENDENT_AMBULATORY_CARE_PROVIDER_SITE_OTHER): Payer: Medicare HMO | Admitting: Ophthalmology

## 2014-08-20 ENCOUNTER — Ambulatory Visit (INDEPENDENT_AMBULATORY_CARE_PROVIDER_SITE_OTHER): Payer: Medicare HMO | Admitting: Ophthalmology

## 2014-08-20 DIAGNOSIS — H3531 Nonexudative age-related macular degeneration: Secondary | ICD-10-CM

## 2014-08-20 DIAGNOSIS — H35373 Puckering of macula, bilateral: Secondary | ICD-10-CM

## 2014-08-20 DIAGNOSIS — I10 Essential (primary) hypertension: Secondary | ICD-10-CM

## 2014-08-20 DIAGNOSIS — H35033 Hypertensive retinopathy, bilateral: Secondary | ICD-10-CM

## 2014-08-20 DIAGNOSIS — H43811 Vitreous degeneration, right eye: Secondary | ICD-10-CM

## 2015-07-07 ENCOUNTER — Encounter (INDEPENDENT_AMBULATORY_CARE_PROVIDER_SITE_OTHER): Payer: Medicare HMO | Admitting: Ophthalmology

## 2015-08-06 ENCOUNTER — Encounter (INDEPENDENT_AMBULATORY_CARE_PROVIDER_SITE_OTHER): Payer: Medicare Other | Admitting: Ophthalmology

## 2015-08-06 DIAGNOSIS — H43811 Vitreous degeneration, right eye: Secondary | ICD-10-CM

## 2015-08-06 DIAGNOSIS — H35372 Puckering of macula, left eye: Secondary | ICD-10-CM

## 2015-08-06 DIAGNOSIS — H35033 Hypertensive retinopathy, bilateral: Secondary | ICD-10-CM

## 2015-08-06 DIAGNOSIS — I10 Essential (primary) hypertension: Secondary | ICD-10-CM | POA: Diagnosis not present

## 2015-08-06 DIAGNOSIS — H2511 Age-related nuclear cataract, right eye: Secondary | ICD-10-CM | POA: Diagnosis not present

## 2015-08-23 ENCOUNTER — Ambulatory Visit (INDEPENDENT_AMBULATORY_CARE_PROVIDER_SITE_OTHER): Payer: Medicare HMO | Admitting: Ophthalmology

## 2016-08-09 ENCOUNTER — Ambulatory Visit (INDEPENDENT_AMBULATORY_CARE_PROVIDER_SITE_OTHER): Payer: Medicare Other | Admitting: Ophthalmology

## 2016-09-11 ENCOUNTER — Ambulatory Visit (INDEPENDENT_AMBULATORY_CARE_PROVIDER_SITE_OTHER): Payer: Medicare Other | Admitting: Ophthalmology

## 2017-09-21 DEATH — deceased
